# Patient Record
Sex: Male | Born: 1990
Health system: Southern US, Community
[De-identification: ages and names within clinical notes are randomized; demographics above are authoritative.]

---
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
## 2016-12-15 ENCOUNTER — Emergency Department (HOSPITAL_BASED_OUTPATIENT_CLINIC_OR_DEPARTMENT_OTHER)
Admission: EM | Admit: 2016-12-15 | Discharge: 2016-12-15 | Disposition: A | Discharge status: Home / Self Care | Payer: BLUE CROSS/BLUE SHIELD | Attending: Emergency Medicine | Admitting: Emergency Medicine

## 2016-12-15 ENCOUNTER — Emergency Department (HOSPITAL_BASED_OUTPATIENT_CLINIC_OR_DEPARTMENT_OTHER): Payer: BLUE CROSS/BLUE SHIELD

## 2016-12-15 ENCOUNTER — Encounter (HOSPITAL_BASED_OUTPATIENT_CLINIC_OR_DEPARTMENT_OTHER): Payer: Self-pay | Admitting: *Deleted

## 2016-12-15 DIAGNOSIS — M25511 Pain in right shoulder: Secondary | ICD-10-CM | POA: Insufficient documentation

## 2016-12-15 DIAGNOSIS — S62115A Nondisplaced fracture of triquetrum [cuneiform] bone, left wrist, initial encounter for closed fracture: Secondary | ICD-10-CM | POA: Diagnosis not present

## 2016-12-15 DIAGNOSIS — Z91018 Allergy to other foods: Secondary | ICD-10-CM | POA: Diagnosis not present

## 2016-12-15 DIAGNOSIS — Y93I9 Activity, other involving external motion: Secondary | ICD-10-CM | POA: Insufficient documentation

## 2016-12-15 DIAGNOSIS — S40211A Abrasion of right shoulder, initial encounter: Secondary | ICD-10-CM | POA: Diagnosis not present

## 2016-12-15 DIAGNOSIS — Y999 Unspecified external cause status: Secondary | ICD-10-CM | POA: Diagnosis not present

## 2016-12-15 DIAGNOSIS — Y929 Unspecified place or not applicable: Secondary | ICD-10-CM | POA: Diagnosis not present

## 2016-12-15 DIAGNOSIS — S6992XA Unspecified injury of left wrist, hand and finger(s), initial encounter: Secondary | ICD-10-CM | POA: Diagnosis present

## 2016-12-15 MED ORDER — NAPROXEN 500 MG PO TABS: 500.0000 mg | ORAL_TABLET | Freq: Two times a day (BID) | ORAL | 0 refills | Status: DC
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
## 2016-12-15 NOTE — Discharge Instructions (Signed)
Make an appointment with a sports medicine doctor or an orthopedic doctor, keep the splint on until you are evaluated by them. Take Naprosyn as needed for pain, apply ice to areas of swelling and tenderness

## 2016-12-15 NOTE — ED Notes (Signed)
Patient transported to X-ray 

## 2016-12-16 ENCOUNTER — Encounter: Payer: Self-pay | Admitting: Family Medicine

## 2016-12-16 ENCOUNTER — Ambulatory Visit (INDEPENDENT_AMBULATORY_CARE_PROVIDER_SITE_OTHER): Payer: BLUE CROSS/BLUE SHIELD | Admitting: Family Medicine

## 2016-12-16 DIAGNOSIS — S6992XD Unspecified injury of left wrist, hand and finger(s), subsequent encounter: Secondary | ICD-10-CM | POA: Insufficient documentation

## 2016-12-16 DIAGNOSIS — S4991XD Unspecified injury of right shoulder and upper arm, subsequent encounter: Secondary | ICD-10-CM | POA: Insufficient documentation

## 2016-12-16 DIAGNOSIS — S6992XA Unspecified injury of left wrist, hand and finger(s), initial encounter: Secondary | ICD-10-CM | POA: Diagnosis not present

## 2016-12-16 DIAGNOSIS — S4991XA Unspecified injury of right shoulder and upper arm, initial encounter: Secondary | ICD-10-CM
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
## 2016-12-16 NOTE — Assessment & Plan Note (Signed)
independently reviewed radiographs and dorsal avulsion fractures noted of triquetrum.  Discussed these typically respond well to conservative treatment and only require wrist bracing - to wear as he would a cast expect when washing.  Icing, tylenol and/or naproxen.  F/u in 1-2 weeks.

## 2016-12-16 NOTE — Patient Instructions (Signed)
You have a Grade 1-2 shoulder separation (AC sprain) Ice the area 3-4 times a day for 15 minutes at a time Tylenol and/or aleve/naproxen as needed for pain Range of motion exercises when tolerated (arm circles, wall walking, pendulum) When able to do these range of motion exercises, will advance to rotator cuff and scapular stabilizer strengthening/exercises. Follow up with me in 1-2 weeks for reevaluation. Expect 2-4 weeks to heal.  You have dorsal avulsion triquetrum fractures. These usually heal well with conservative treatment over 4-6 weeks. Icing, medicines as noted above. Wear wrist brace at all times except when washing.

## 2016-12-16 NOTE — Assessment & Plan Note (Signed)
independently reviewed radiographs and no abnormalities.  Exam consistent with Grade 1 or 2 AC sprain.  Icing, naproxen, tylenol as needed.  Shown motion exercises to do as tolerated.  Expect 2-4 weeks to heal.

## 2016-12-30 ENCOUNTER — Encounter: Payer: Self-pay | Admitting: Family Medicine

## 2016-12-30 ENCOUNTER — Ambulatory Visit (INDEPENDENT_AMBULATORY_CARE_PROVIDER_SITE_OTHER): Payer: BLUE CROSS/BLUE SHIELD | Admitting: Family Medicine

## 2016-12-30 DIAGNOSIS — S4991XD Unspecified injury of right shoulder and upper arm, subsequent encounter: Secondary | ICD-10-CM | POA: Diagnosis not present

## 2016-12-30 DIAGNOSIS — S6992XD Unspecified injury of left wrist, hand and finger(s), subsequent encounter: Secondary | ICD-10-CM | POA: Diagnosis not present

## 2016-12-30 NOTE — Patient Instructions (Signed)
Wear the wrist brace until August 26th then can discontinue this. Tylenol, icing, ibuprofen only if needed. Do scapular squeezes and rows 3 sets of 10 once a day for 4-6 weeks. I would expect you to feel completely better by 2 weeks from now. If you're struggling over 2-4 weeks follow up with me at that time otherwise follow up as needed.

## 2016-12-31 NOTE — Assessment & Plan Note (Signed)
2/2 Grade 1 or 2 AC sprain.  Icing, naproxen, tylenol as needed.  Start strengthening including scapular squeezes for right sided winging.  Expect complete improvement by 4 weeks.  Call for appointment if having issues over 2-4 more weeks.
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
## 2016-12-31 NOTE — Assessment & Plan Note (Signed)
Dorsal avulsion fractures of triquetrum.  Continue wrist brace until 4 weeks out then discontinue.  F/u in 2-4 weeks if still having issues.  Icing, tylenol and/or naproxen if needed.
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
## 2020-02-14 ENCOUNTER — Ambulatory Visit (INDEPENDENT_AMBULATORY_CARE_PROVIDER_SITE_OTHER): Payer: BC Managed Care – PPO | Admitting: Sports Medicine

## 2020-02-14 ENCOUNTER — Ambulatory Visit (INDEPENDENT_AMBULATORY_CARE_PROVIDER_SITE_OTHER): Payer: BC Managed Care – PPO

## 2020-02-14 ENCOUNTER — Other Ambulatory Visit: Payer: Self-pay

## 2020-02-14 DIAGNOSIS — S4991XD Unspecified injury of right shoulder and upper arm, subsequent encounter: Secondary | ICD-10-CM

## 2020-02-14 DIAGNOSIS — M25511 Pain in right shoulder: Secondary | ICD-10-CM

## 2020-02-14 MED ORDER — MELOXICAM 15 MG PO TABS: ORAL_TABLET | ORAL | 3 refills | Status: AC
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
## 2020-03-27 ENCOUNTER — Ambulatory Visit (INDEPENDENT_AMBULATORY_CARE_PROVIDER_SITE_OTHER): Payer: BC Managed Care – PPO | Admitting: Sports Medicine

## 2020-03-27 DIAGNOSIS — S4991XD Unspecified injury of right shoulder and upper arm, subsequent encounter: Secondary | ICD-10-CM
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
## 2021-02-17 IMAGING — DX DG SHOULDER 2+V*R*
3 series · 3 of 3 positions shown · non-contrast
Comparison: 12/15/2016

CLINICAL DATA: Right shoulder pain

EXAM:
RIGHT SHOULDER - 2+ VIEW

[shoulder grashey]
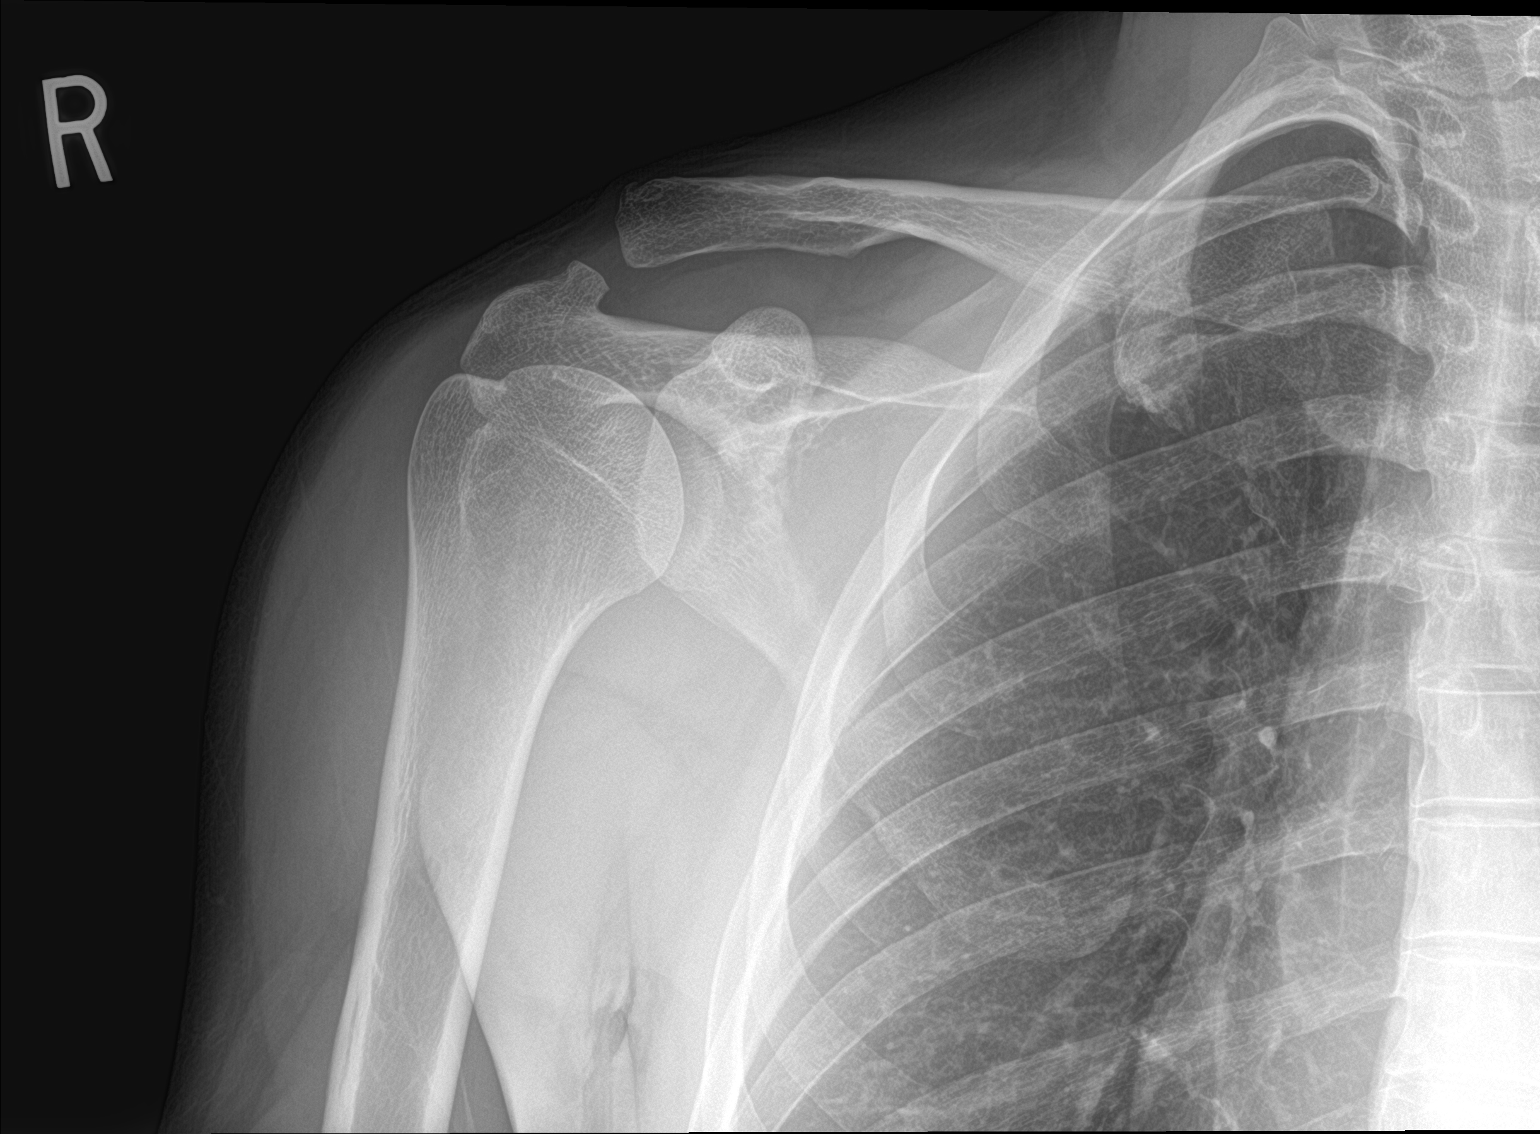

[shoulder y view]
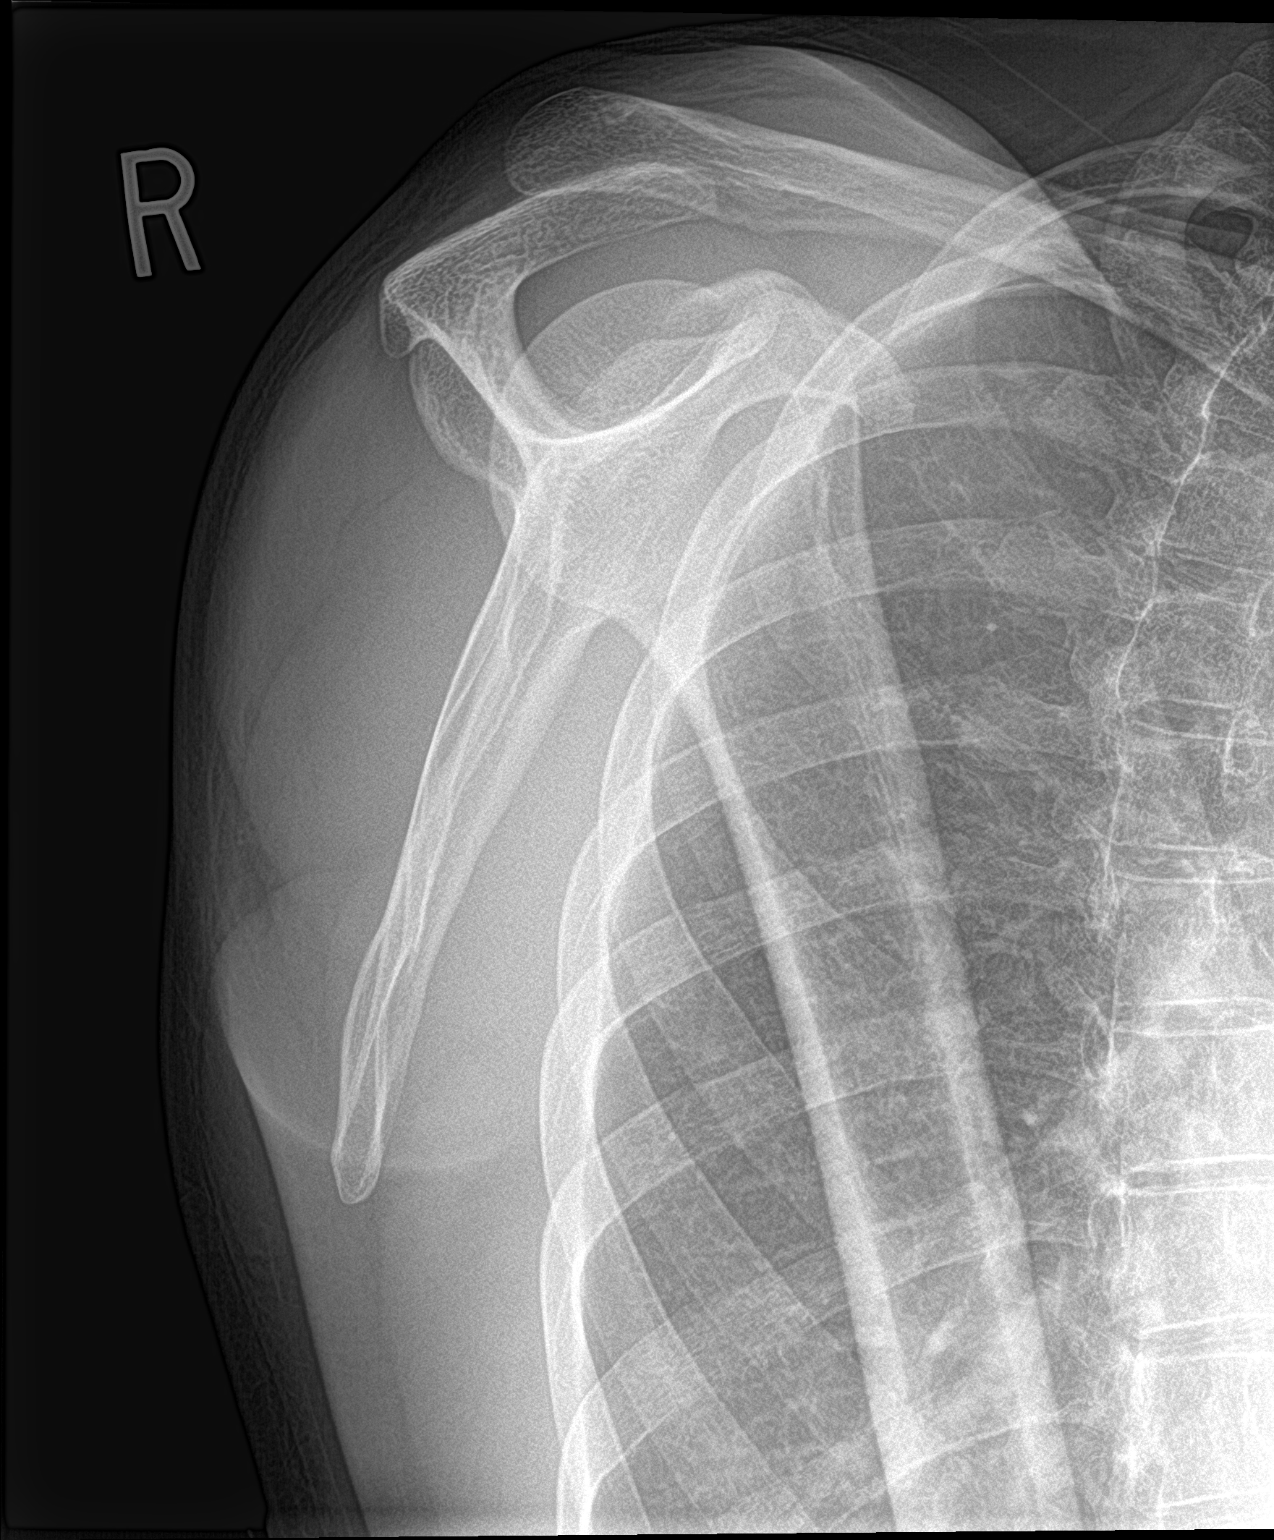

[shoulder axillary]
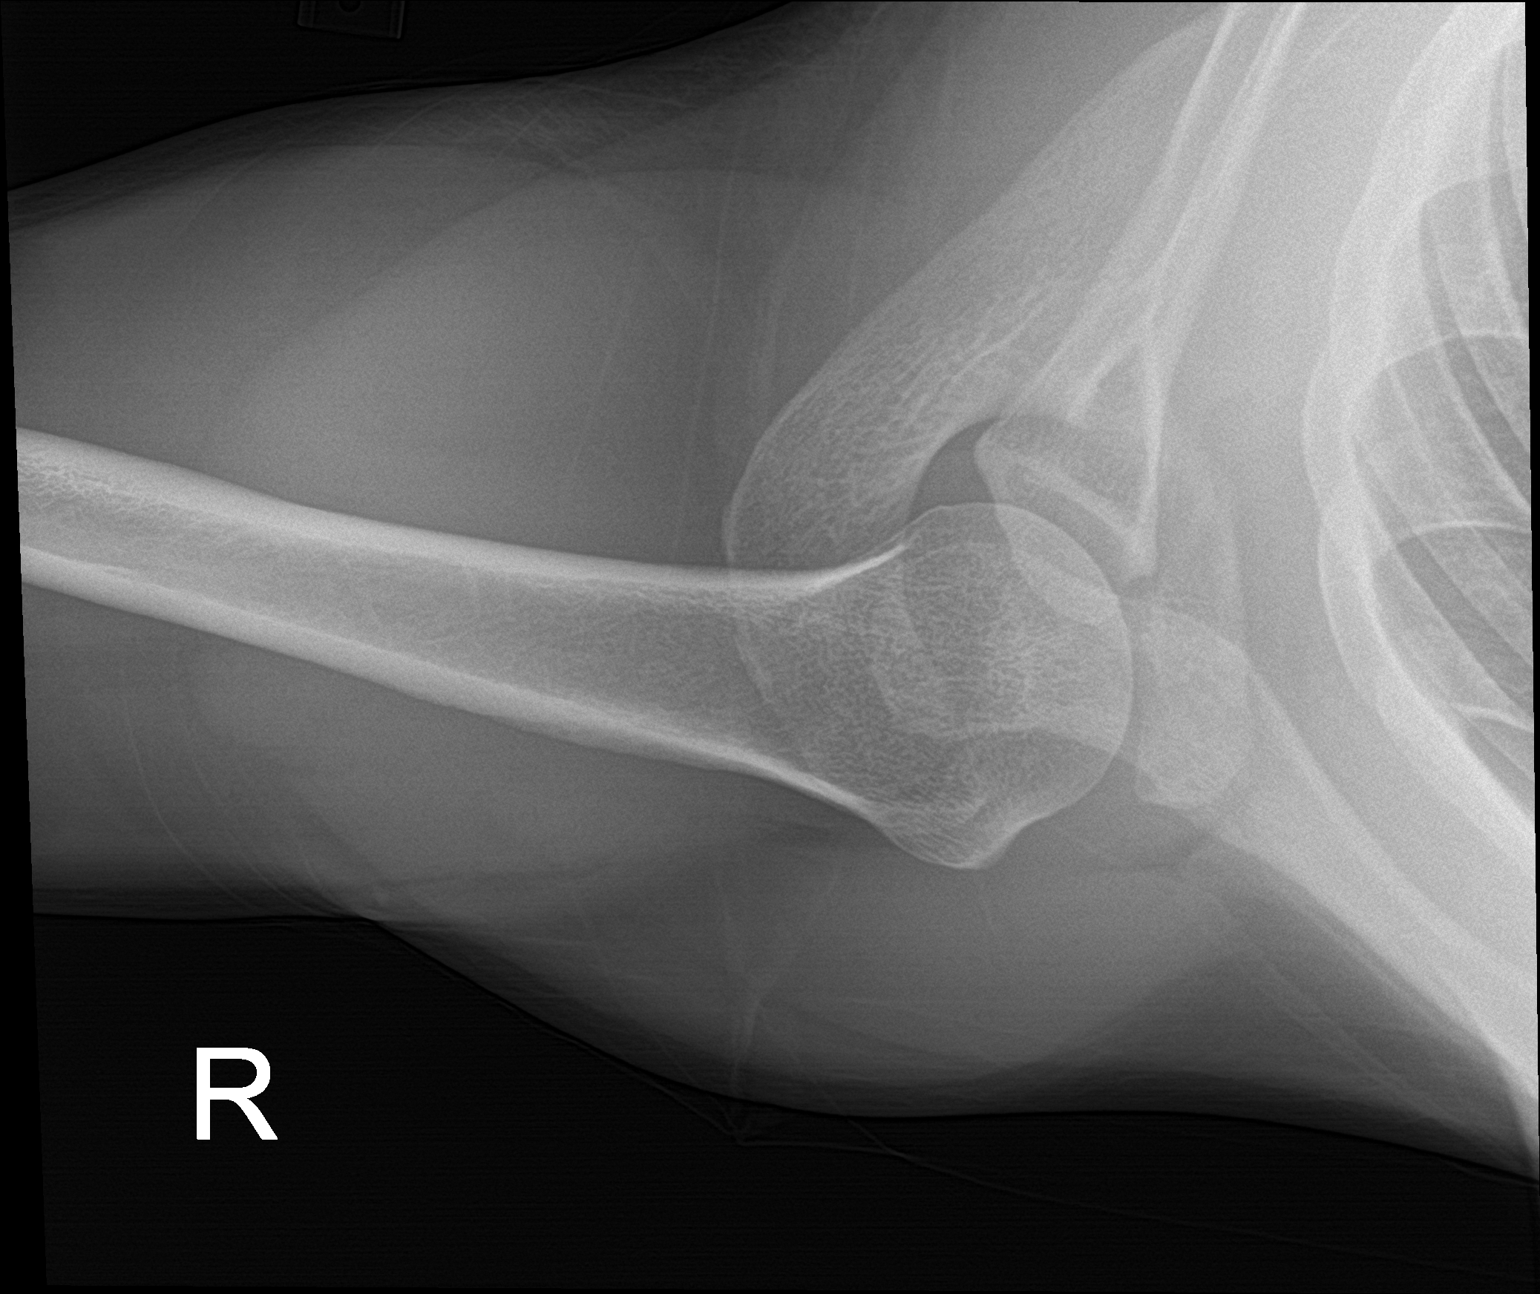

[3 of 3 positions shown; findings below may reference images not displayed]

FINDINGS: There is no evidence of fracture or dislocation. There is no
evidence of arthropathy or other focal bone abnormality. Soft
tissues are unremarkable.
IMPRESSION: Negative.
